# Patient Record
Sex: Male | Born: 2000 | Race: Black or African American | Hispanic: No | Marital: Single | Smoking: Never smoker
Health system: Southern US, Community
[De-identification: ages and names within clinical notes are randomized; demographics above are authoritative.]

## PROBLEM LIST (undated history)

## (undated) DIAGNOSIS — N200 Calculus of kidney: Secondary | ICD-10-CM

## (undated) DIAGNOSIS — J302 Other seasonal allergic rhinitis: Secondary | ICD-10-CM

---
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
## 2022-09-02 ENCOUNTER — Other Ambulatory Visit: Payer: Self-pay

## 2022-09-02 ENCOUNTER — Emergency Department (HOSPITAL_BASED_OUTPATIENT_CLINIC_OR_DEPARTMENT_OTHER)
Admission: EM | Admit: 2022-09-02 | Discharge: 2022-09-02 | Disposition: A | Discharge status: Home / Self Care | Payer: Medicaid Other | Attending: Emergency Medicine | Admitting: Emergency Medicine

## 2022-09-02 DIAGNOSIS — J029 Acute pharyngitis, unspecified: Secondary | ICD-10-CM | POA: Diagnosis present

## 2022-09-02 LAB — GROUP A STREP BY PCR: Group A Strep by PCR: DETECTED — AB

## 2022-09-02 MED ORDER — AMOXICILLIN 500 MG PO CAPS: 500.0000 mg | ORAL_CAPSULE | Freq: Every day | ORAL | 0 refills | Status: AC

## 2022-09-02 MED ORDER — DEXAMETHASONE 4 MG PO TABS
10.0000 mg | ORAL_TABLET | Freq: Once | ORAL | Status: AC
  Administered 2022-09-02: 10 mg via ORAL
  Filled 2022-09-02: qty 3

## 2022-09-02 NOTE — ED Notes (Signed)
Pt discharged home after verbalizing understanding of discharge instructions; nad noted. 

## 2022-09-02 NOTE — Discharge Instructions (Signed)
I am treating you for strep throat.  Take first dose antibiotic tonight.  You have been treated with a long-acting steroid.  Recommend 1000 mg of Tylenol every 6 hours as needed for pain as well as 800 mg ibuprofen every 8 hours as needed for pain.  The long-acting steroid should greatly help your discomfort as well.  Please return if you are not having any major improvement after 48 to 72 hours.

## 2022-09-02 NOTE — ED Triage Notes (Signed)
Pt via pov from home with sore throat x 2 days. Pt states it is very painful to talk or swallow or move his head side to side. He reports that he is using his nose for breathing and that he is able to swallow his saliva, although with significant pain. Pt alert & oriented, nad noted.

## 2022-09-02 NOTE — ED Provider Notes (Signed)
[place] EMERGENCY DEPARTMENT AT [facility] Provider Note   CSN: [id number] Arrival date & time: 09/02/22  2032     History  Chief Complaint  Patient presents with   Sore Throat    Darius Cruz is a 22 y.o. male.  Patient here with sore throat for can last couple days.  Nothing makes it worse or better.  He denies any fever or chills.  He is having some pain when he swallows.  He has no difficulty opening his mouth or eating or drinking.  Denies any sick contacts.  Denies any chest pain or shortness of breath.  The history is provided by the patient.       Home Medications Prior to Admission medications   Medication Sig Start Date End Date Taking? Authorizing Provider  amoxicillin (AMOXIL) 500 MG capsule Take 1 capsule (500 mg total) by mouth daily for 10 days. 09/02/22 09/12/22 Yes Chrishaun Sasso, DO  cetirizine (ZYRTEC) 10 MG tablet Take 10 mg by mouth daily.    [provider]  ibuprofen (ADVIL) 400 MG tablet Take 1 tablet (400 mg total) by mouth every 6 (six) hours as needed for up to 20 doses for mild pain or moderate pain. 04/29/19   Terald Sleeper, MD  Loratadine (CLARITIN PO) Take by mouth.    [provider]      Allergies    Patient has no known allergies.    Review of Systems   Review of Systems  Physical Exam Updated Vital Signs BP (!) 146/85 (BP Location: Right Arm)   Pulse 95   Temp 99.8 F (37.7 C) (Temporal)   Resp 18   Ht 5\' 9"  (1.753 m)   Wt 74.2 kg   SpO2 100%   BMI 24.16 kg/m  Physical Exam Vitals and nursing note reviewed.  Constitutional:      General: He is not in acute distress.    Appearance: He is well-developed.  HENT:     Head: Normocephalic and atraumatic.     Right Ear: Tympanic membrane normal.     Left Ear: Tympanic membrane normal.     Mouth/Throat:     Mouth: Mucous membranes are moist.     Pharynx: No uvula swelling.     Tonsils: Tonsillar exudate present. No tonsillar abscesses. 1+ on the  right. 1+ on the left.  Eyes:     Conjunctiva/sclera: Conjunctivae normal.  Cardiovascular:     Rate and Rhythm: Normal rate and regular rhythm.     Heart sounds: No murmur heard. Pulmonary:     Effort: Pulmonary effort is normal. No respiratory distress.     Breath sounds: Normal breath sounds.  Abdominal:     Palpations: Abdomen is soft.     Tenderness: There is no abdominal tenderness.  Musculoskeletal:        General: No swelling.     Cervical back: Normal range of motion and neck supple.  Lymphadenopathy:     Cervical: No cervical adenopathy.  Skin:    General: Skin is warm and dry.     Capillary Refill: Capillary refill takes less than 2 seconds.  Neurological:     Mental Status: He is alert.  Psychiatric:        Mood and Affect: Mood normal.     ED Results / Procedures / Treatments   Labs (all labs ordered are listed, but only abnormal results are displayed) Labs Reviewed  GROUP A STREP BY PCR    EKG None  Radiology No results found.  Procedures Procedures    Medications Ordered in ED Medications  dexamethasone (DECADRON) tablet 10 mg (has no administration in time range)    ED Course/ Medical Decision Making/ A&P                             Medical Decision Making Risk Prescription drug management.   Verneita Griffes here with sore throat.  Normal vitals.  No fever.  He appears to have a pharyngitis on exam.  I will empirically treat for strep pharyngitis.  Given Decadron.  He has no trismus or drooling.  No signs of abscess on exam.  No submandibular swelling.  Normal range of motion of the neck.  This could be viral process as well.  He understands return precautions.  Discharged in good condition.  Commend Tylenol and ibuprofen for pain as well.  This chart was dictated using voice recognition software.  Despite best efforts to proofread,  errors can occur which can change the documentation meaning.         Final Clinical Impression(s) / ED  Diagnoses Final diagnoses:  Sore throat    Rx / DC Orders ED Discharge Orders          Ordered    amoxicillin (AMOXIL) 500 MG capsule  Daily        09/02/22 2045              Virgina Norfolk, DO 09/02/22 2051

## 2022-09-15 ENCOUNTER — Other Ambulatory Visit: Payer: Self-pay

## 2022-09-15 ENCOUNTER — Emergency Department (HOSPITAL_BASED_OUTPATIENT_CLINIC_OR_DEPARTMENT_OTHER)
Admission: EM | Admit: 2022-09-15 | Discharge: 2022-09-15 | Disposition: A | Discharge status: Home / Self Care | Payer: Medicaid Other | Attending: Emergency Medicine | Admitting: Emergency Medicine

## 2022-09-15 ENCOUNTER — Encounter (HOSPITAL_BASED_OUTPATIENT_CLINIC_OR_DEPARTMENT_OTHER): Payer: Self-pay | Admitting: Emergency Medicine

## 2022-09-15 DIAGNOSIS — J020 Streptococcal pharyngitis: Secondary | ICD-10-CM

## 2022-09-15 DIAGNOSIS — B2790 Infectious mononucleosis, unspecified without complication: Secondary | ICD-10-CM | POA: Insufficient documentation

## 2022-09-15 DIAGNOSIS — J029 Acute pharyngitis, unspecified: Secondary | ICD-10-CM | POA: Diagnosis present

## 2022-09-15 LAB — MONONUCLEOSIS SCREEN: Mono Screen: POSITIVE — AB

## 2022-09-15 LAB — GROUP A STREP BY PCR: Group A Strep by PCR: DETECTED — AB

## 2022-09-15 MED ORDER — CEPHALEXIN 500 MG PO CAPS: 500.0000 mg | ORAL_CAPSULE | Freq: Two times a day (BID) | ORAL | 0 refills | Status: AC

## 2022-09-15 NOTE — ED Provider Notes (Signed)
[place] EMERGENCY DEPARTMENT AT [facility], [place] Provider Note   CSN: [id number] Arrival date & time: 09/15/22  1841     History  No chief complaint on file.   Darius Cruz is a 22 y.o. male, no pertinent past medical history, who presents to the ED secondary to sore throat for the last 2 days.  He feels like his tonsils are swollen again.  Just recently got treated for strep, and stopped treatment about 2 days ago, when his symptoms reoccurred.  He states that his girlfriend/partner was not treated, or tested for strep, and he has been seeing her after being treated.  Does not know if she gave it back to him, as now she is feeling sick.  Denies any fever, chills, cough, nausea, vomiting.  Reports bad fatigue.     Home Medications Prior to Admission medications   Medication Sig Start Date End Date Taking? Authorizing Provider  cephALEXin (KEFLEX) 500 MG capsule Take 1 capsule (500 mg total) by mouth 2 (two) times daily for 10 days. 09/15/22 09/25/22 Yes Cristle Jared L, PA  cetirizine (ZYRTEC) 10 MG tablet Take 10 mg by mouth daily.    [provider]  ibuprofen (ADVIL) 400 MG tablet Take 1 tablet (400 mg total) by mouth every 6 (six) hours as needed for up to 20 doses for mild pain or moderate pain. 04/29/19   Terald Sleeper, MD  Loratadine (CLARITIN PO) Take by mouth.    [provider]      Allergies    Patient has no known allergies.    Review of Systems   Review of Systems  HENT:  Positive for sore throat. Negative for facial swelling.     Physical Exam Updated Vital Signs BP 120/77 (BP Location: Right Arm)   Pulse 78   Temp 98.7 F (37.1 C) (Oral)   Resp 20   SpO2 100%  Physical Exam Vitals and nursing note reviewed.  Constitutional:      General: He is not in acute distress.    Appearance: He is well-developed.  HENT:     Head: Normocephalic and atraumatic.     Right Ear: Tympanic membrane normal.     Left Ear: Tympanic membrane  normal.     Nose: Nose normal.     Mouth/Throat:     Mouth: Mucous membranes are moist.     Pharynx: Uvula midline. Posterior oropharyngeal erythema present. No pharyngeal swelling or uvula swelling.     Tonsils: No tonsillar exudate or tonsillar abscesses. 3+ on the right. 3+ on the left.  Eyes:     Conjunctiva/sclera: Conjunctivae normal.  Cardiovascular:     Rate and Rhythm: Normal rate and regular rhythm.     Heart sounds: No murmur heard. Pulmonary:     Effort: Pulmonary effort is normal. No respiratory distress.     Breath sounds: Normal breath sounds.  Abdominal:     Palpations: Abdomen is soft.     Tenderness: There is no abdominal tenderness.  Musculoskeletal:        General: No swelling.     Cervical back: Neck supple.  Skin:    General: Skin is warm and dry.     Capillary Refill: Capillary refill takes less than 2 seconds.  Neurological:     Mental Status: He is alert.  Psychiatric:        Mood and Affect: Mood normal.     ED Results / Procedures / Treatments   Labs (all labs ordered  are listed, but only abnormal results are displayed) Labs Reviewed  GROUP A STREP BY PCR - Abnormal; Notable for the following components:      Result Value   Group A Strep by PCR DETECTED (*)    All other components within normal limits  MONONUCLEOSIS SCREEN - Abnormal; Notable for the following components:   Mono Screen POSITIVE (*)    All other components within normal limits    EKG None  Radiology No results found.  Procedures Procedures    Medications Ordered in ED Medications - No data to display  ED Course/ Medical Decision Making/ A&P                             Medical Decision Making Patient is a 22 year old male, here for sore throat for the last 2 days.  Just recently got he is treated for Trep strep, stopped treatment about 2 days ago after completing 10 days of treatment.  He was on amoxicillin, had resolution of symptoms, and now it is back.  States  his girlfriend is sick as well.  Will test for strep and mono given the fatigue.  Overall well-appearing, does not appear to have any kind of PTA.  Was compliant with antibiotics per patient.  Amount and/or Complexity of Data Reviewed Labs: ordered.    Details: Strep positive, mono positive Discussion of management or test interpretation with external provider(s): Discussed with patient, he is strep positive, and mono positive, it is possible that his girlfriend may have given it back to him, and caused him strep again, however is unclear, we will treat with him with Keflex this time, as he already finished a dose of amoxicillin.  Additionally we discussed mono, and return precautions, associated with this, and to avoid any kind of contact sport, as this may cause some splenic injury.  Risk Prescription drug management.    Final Clinical Impression(s) / ED Diagnoses Final diagnoses:  Infectious mononucleosis without complication, infectious mononucleosis due to unspecified organism  Strep pharyngitis    Rx / DC Orders ED Discharge Orders          Ordered    cephALEXin (KEFLEX) 500 MG capsule  2 times daily        09/15/22 2155              Pete Pelt, [place] 09/15/22 2157    Benjiman Core, MD 09/15/22 [phone]

## 2022-09-15 NOTE — ED Triage Notes (Signed)
Congested, runny nose (allergies). Pt just treated for strep throat. Finished and then the next day his throat was hurting and felt like tonsils were swollen.

## 2022-09-15 NOTE — Discharge Instructions (Addendum)
Please follow-up with your primary care doctor, you have strep and mono.  Please avoid any kind of high contact sports, as it may cause any kind of splenic injury.  Take the antibiotics as prescribed, you failed amoxicillin, so sent to Keflex for treatment of your strep.  Take it in completion.  Make sure that you have anyone that you she struggles with, kids, or have any kind of bodily fluids Swapp, with tested for strep, as this may be passing back-and-forth between you 2.  Return to the ER if you have any kind of facial swelling, difficulty breathing, worsening swelling of your throat.

## 2022-09-29 ENCOUNTER — Emergency Department (HOSPITAL_BASED_OUTPATIENT_CLINIC_OR_DEPARTMENT_OTHER)
Admission: EM | Admit: 2022-09-29 | Discharge: 2022-09-29 | Disposition: A | Discharge status: Home / Self Care | Payer: Medicaid Other | Attending: Emergency Medicine | Admitting: Emergency Medicine

## 2022-09-29 DIAGNOSIS — Z202 Contact with and (suspected) exposure to infections with a predominantly sexual mode of transmission: Secondary | ICD-10-CM | POA: Diagnosis present

## 2022-09-29 DIAGNOSIS — Z113 Encounter for screening for infections with a predominantly sexual mode of transmission: Secondary | ICD-10-CM

## 2022-09-29 LAB — HIV ANTIBODY (ROUTINE TESTING W REFLEX): HIV Screen 4th Generation wRfx: NONREACTIVE

## 2022-09-29 NOTE — Discharge Instructions (Signed)
Please read and follow all provided instructions.  Your diagnoses today include:  1. Screening examination for STI     Tests performed today include: Test for gonorrhea and chlamydia.  Test for HIV and syphilis.  Vital signs. See below for your results today.   Medications:  None  Home care instructions:  Read educational materials contained in this packet and follow any instructions provided.   You should tell your partners about your infection and avoid having sex for one week to allow time for the medicine to work.  Sexually transmitted disease testing also available at:  [facility] [address] [place], phone [phone] or [phone]   Monday - Friday, call for an appointment  Return instructions:  Please return to the Emergency Department if you experience worsening symptoms.  Please return if you have any other emergent concerns.  Additional Information:  Your vital signs today were: BP 132/81 (BP Location: Right Arm)   Pulse 72   Temp 98.4 F (36.9 C) (Oral)   Resp 17   SpO2 99%  If your blood pressure (BP) was elevated above 135/85 this visit, please have this repeated by your doctor within one month. --------------

## 2022-09-29 NOTE — ED Triage Notes (Signed)
Pt reports he is here for an STD check to followup on chlamydia tx he received in March.  Denies any symptoms at present.

## 2022-09-29 NOTE — ED Provider Notes (Signed)
[place] EMERGENCY DEPARTMENT AT [facility] Provider Note   CSN: [id number] Arrival date & time: 09/29/22  0849     History  Chief Complaint  Patient presents with   SEXUALLY TRANSMITTED DISEASE    Darius Cruz is a 22 y.o. male.  Patient presents to the emergency department today with request for STI testing.  Patient states that he had chlamydia 2 to 3 months ago.  He was treated with a one-time oral dose of antibiotics.  He is currently asymptomatic.  He would like testing today for reassurance due to a new sexual partner.  He denies penile discharge or drainage, dysuria, any rashes or lesions externally.  He is also agreeable to blood testing.  Of note, patient was diagnosed with strep throat and mononucleosis several weeks ago.  He states that from the standpoint he is gradually improving.  He continues to have a mild sore throat but states that the antibiotics he was given helped a lot.       Home Medications Prior to Admission medications   Medication Sig Start Date End Date Taking? Authorizing Provider  cetirizine (ZYRTEC) 10 MG tablet Take 10 mg by mouth daily.    [provider]  ibuprofen (ADVIL) 400 MG tablet Take 1 tablet (400 mg total) by mouth every 6 (six) hours as needed for up to 20 doses for mild pain or moderate pain. 04/29/19   Terald Sleeper, MD  Loratadine (CLARITIN PO) Take by mouth.    [provider]      Allergies    Patient has no known allergies.    Review of Systems   Review of Systems  Physical Exam Updated Vital Signs BP 132/81 (BP Location: Right Arm)   Pulse 72   Temp 98.4 F (36.9 C) (Oral)   Resp 17   SpO2 99%  Physical Exam Vitals and nursing note reviewed.  Constitutional:      Appearance: He is well-developed.  HENT:     Head: Normocephalic and atraumatic.  Eyes:     Conjunctiva/sclera: Conjunctivae normal.  Pulmonary:     Effort: No respiratory distress.  Genitourinary:    Comments:  Patient declines GU exam due to no symptoms Musculoskeletal:     Cervical back: Normal range of motion and neck supple.  Skin:    General: Skin is warm and dry.  Neurological:     Mental Status: He is alert.     ED Results / Procedures / Treatments   Labs (all labs ordered are listed, but only abnormal results are displayed) Labs Reviewed - No data to display  EKG None  Radiology No results found.  Procedures Procedures    Medications Ordered in ED Medications - No data to display  ED Course/ Medical Decision Making/ A&P    Patient seen and examined. History obtained directly from patient.    Labs/EKG: Urine GC and chlamydia ordered, HIV and RPR ordered.  Imaging: None ordered  Medications/Fluids: None ordered  Most recent vital signs reviewed and are as follows: BP 132/81 (BP Location: Right Arm)   Pulse 72   Temp 98.4 F (36.9 C) (Oral)   Resp 17   SpO2 99%   Initial impression: STI screening encounter  Home treatment plan: Patient counseled on safe sexual practices, encouraged them to avoid sexual contact for 7 days if any testing positive and to inform sexual partners so that they can get tested and treated as well.   Return instructions discussed with patient: As  needed  Follow-up instructions discussed with patient: PCP as needed                            Medical Decision Making Amount and/or Complexity of Data Reviewed Labs: ordered.   Patient asymptomatic, here for STD screening.  Appears well.        Final Clinical Impression(s) / ED Diagnoses Final diagnoses:  None    Rx / DC Orders ED Discharge Orders     None         Renne Crigler, PA-C 09/29/22 1021    Mardene Sayer, MD 09/29/22 1750

## 2022-09-30 LAB — RPR: RPR Ser Ql: NONREACTIVE

## 2022-10-02 LAB — GC/CHLAMYDIA PROBE AMP (~~LOC~~) NOT AT ARMC
Chlamydia: NEGATIVE
Comment: NEGATIVE
Comment: NORMAL
Neisseria Gonorrhea: NEGATIVE

## 2022-11-02 ENCOUNTER — Other Ambulatory Visit: Payer: Self-pay

## 2022-11-02 ENCOUNTER — Encounter (HOSPITAL_BASED_OUTPATIENT_CLINIC_OR_DEPARTMENT_OTHER): Payer: Self-pay | Admitting: Emergency Medicine

## 2022-11-02 ENCOUNTER — Emergency Department (HOSPITAL_BASED_OUTPATIENT_CLINIC_OR_DEPARTMENT_OTHER)
Admission: EM | Admit: 2022-11-02 | Discharge: 2022-11-02 | Disposition: A | Discharge status: Home / Self Care | Payer: Medicaid Other | Attending: Emergency Medicine | Admitting: Emergency Medicine

## 2022-11-02 DIAGNOSIS — N529 Male erectile dysfunction, unspecified: Secondary | ICD-10-CM | POA: Diagnosis present

## 2022-11-02 DIAGNOSIS — Z202 Contact with and (suspected) exposure to infections with a predominantly sexual mode of transmission: Secondary | ICD-10-CM

## 2022-11-02 LAB — URINALYSIS, ROUTINE W REFLEX MICROSCOPIC
Bilirubin Urine: NEGATIVE
Glucose, UA: NEGATIVE mg/dL
Hgb urine dipstick: NEGATIVE
Ketones, ur: NEGATIVE mg/dL
Leukocytes,Ua: NEGATIVE
Nitrite: NEGATIVE
Protein, ur: NEGATIVE mg/dL
Specific Gravity, Urine: 1.026 (ref 1.005–1.030)
pH: 7 (ref 5.0–8.0)

## 2022-11-02 LAB — HIV ANTIBODY (ROUTINE TESTING W REFLEX): HIV Screen 4th Generation wRfx: NONREACTIVE

## 2022-11-02 NOTE — ED Notes (Signed)
Discharge instructions and follow up care reviewed and explained, pt verbalized understanding and had no further questions on d/c. Pt caox4, ambulatory, NAD on d/c. 

## 2022-11-02 NOTE — ED Triage Notes (Signed)
Pt arrived POV from home. Pt caox4, ambulatory, NAD. Pt c/o "not being able to get an erection" for the past week but that it first started in May but has become a constant issue the past week. Pt also reports pain at the base of the penis and "pain in the vein in the penis feeling like something is blocking blood flow." Pt also reports bladder discomfort with increased frequency in urination with decreased output, however denies painful urination. Pt denies fever, chills, penile discharge. Hx kidney stones.

## 2022-11-02 NOTE — ED Notes (Signed)
Unsuccessful lab draw RT hand, pt tolerated well

## 2022-11-02 NOTE — ED Provider Notes (Signed)
[place] EMERGENCY DEPARTMENT AT [facility] Provider Note   CSN: [id number] Arrival date & time: 11/02/22  0802     History  Chief Complaint  Patient presents with   Erectile Dysfunction    Darius Cruz is a 22 y.o. male.  Patient is a 22 year old male with no significant past medical history presenting to the emergency department with erectile dysfunction.  The patient states for nearly the last month he has been unable to obtain an erection.  He states that he did have sex with a new partner 1 month ago and is concerned about possible STD exposure but denies any penile discharge or rash, testicular pain or swelling.  He also reports a history of a kidney stone and is concerned that he may have have a kidney stone.  He states since Monday he has had mild intermittent lower abdominal pain.  He denies any fevers or chills, nausea or vomiting, dysuria or hematuria.  Patient denies any recent new medications.  The history is provided by the patient.  Erectile Dysfunction       Home Medications Prior to Admission medications   Medication Sig Start Date End Date Taking? Authorizing Provider  cetirizine (ZYRTEC) 10 MG tablet Take 10 mg by mouth daily.    [provider]  ibuprofen (ADVIL) 400 MG tablet Take 1 tablet (400 mg total) by mouth every 6 (six) hours as needed for up to 20 doses for mild pain or moderate pain. 04/29/19   Terald Sleeper, MD  Loratadine (CLARITIN PO) Take by mouth.    [provider]      Allergies    Patient has no known allergies.    Review of Systems   Review of Systems  Physical Exam Updated Vital Signs BP 132/87 (BP Location: Left Arm)   Pulse 63   Temp 98.3 F (36.8 C) (Oral)   Resp 16   Ht 5\' 8"  (1.727 m)   Wt 79.4 kg   SpO2 99%   BMI 26.61 kg/m  Physical Exam Vitals and nursing note reviewed.  Constitutional:      General: He is not in acute distress.    Appearance: Normal appearance.  HENT:      Head: Normocephalic and atraumatic.     Nose: Nose normal.  Eyes:     Extraocular Movements: Extraocular movements intact.     Conjunctiva/sclera: Conjunctivae normal.  Cardiovascular:     Rate and Rhythm: Normal rate.     Heart sounds: Normal heart sounds.  Pulmonary:     Effort: Pulmonary effort is normal.     Breath sounds: Normal breath sounds.  Abdominal:     General: Abdomen is flat.     Palpations: Abdomen is soft.     Tenderness: There is no abdominal tenderness. There is no right CVA tenderness or left CVA tenderness.  Musculoskeletal:        General: Normal range of motion.     Cervical back: Normal range of motion.  Skin:    General: Skin is warm and dry.  Neurological:     General: No focal deficit present.     Mental Status: He is alert and oriented to person, place, and time.  Psychiatric:        Mood and Affect: Mood normal.        Behavior: Behavior normal.     ED Results / Procedures / Treatments   Labs (all labs ordered are listed, but only abnormal results are displayed) Labs  Reviewed  URINALYSIS, ROUTINE W REFLEX MICROSCOPIC  RPR  HIV ANTIBODY (ROUTINE TESTING W REFLEX)  GC/CHLAMYDIA PROBE AMP ([place]) NOT AT [facility]    EKG None  Radiology No results found.  Procedures Procedures    Medications Ordered in ED Medications - No data to display  ED Course/ Medical Decision Making/ A&P Clinical Course as of 11/02/22 1035  Fri Nov 02, 2022  1011 UA negative making stone unlikely. He is stable for discharge and will be given urology f/u. [VK]    Clinical Course User Index [VK] Rexford Maus, DO                             Medical Decision Making This patient presents to the ED with chief complaint(s) of erectile dysfunction with no pertinent past medical history which further complicates the presenting complaint. The complaint involves an extensive differential diagnosis and also carries with it a high risk of complications and  morbidity.    The differential diagnosis includes STD, UTI, pyelonephritis less likely as he has no significant pain and no associated nausea or vomiting, no recent medication changes making medication effect unlikely, no testicular pain or swelling making torsion or epididymitis unlikely  Additional history obtained: Additional history obtained from N/A Records reviewed prior ED records  ED Course and Reassessment: On patient's arrival he is hemodynamically stable in no acute distress.  The patient is agreeable to STD testing including HIV and syphilis but declined prophylactic treatment at this time.  UA will additionally be of performed to evaluate for hematuria or UTI and he will be closely reassessed.  Independent labs interpretation:  The following labs were independently interpreted: UA negative  Independent visualization of imaging: - N/A  Consultation: - Consulted or discussed management/test interpretation w/ external professional: N/A  Consideration for admission or further workup: Patient has no emergent conditions requiring admission or further work-up at this time and is stable for discharge home with primary care follow-up  Social Determinants of health: N/a    Amount and/or Complexity of Data Reviewed Labs: ordered.          Final Clinical Impression(s) / ED Diagnoses Final diagnoses:  Erectile dysfunction, unspecified erectile dysfunction type  Possible exposure to STD    Rx / DC Orders ED Discharge Orders     None         Rexford Maus, DO 11/02/22 1035

## 2022-11-02 NOTE — Discharge Instructions (Signed)
You were seen in the emergency department for your erectile dysfunction.  Your workup showed no signs of urinary tract infection or kidney stones.  We did test you for STDs and if any of these come back positive you will receive a call.  You should abstain from any drugs or alcohol and keep a healthy lifestyle to help with erectile dysfunction and you can follow-up with your primary doctor or urology for further investigation and management.  You should return to the emergency department if you have testicular pain or swelling, severe abdominal pain, fevers or any other new or concerning symptoms.

## 2022-11-03 LAB — RPR: RPR Ser Ql: NONREACTIVE

## 2022-11-05 LAB — GC/CHLAMYDIA PROBE AMP (~~LOC~~) NOT AT ARMC
Chlamydia: NEGATIVE
Comment: NEGATIVE
Comment: NORMAL
Neisseria Gonorrhea: NEGATIVE

## 2022-11-11 ENCOUNTER — Emergency Department (HOSPITAL_BASED_OUTPATIENT_CLINIC_OR_DEPARTMENT_OTHER)
Admission: EM | Admit: 2022-11-11 | Discharge: 2022-11-11 | Disposition: A | Discharge status: Home / Self Care | Payer: Medicaid Other | Source: Home / Self Care | Attending: Emergency Medicine | Admitting: Emergency Medicine

## 2022-11-11 ENCOUNTER — Encounter (HOSPITAL_BASED_OUTPATIENT_CLINIC_OR_DEPARTMENT_OTHER): Payer: Self-pay

## 2022-11-11 DIAGNOSIS — N6342 Unspecified lump in left breast, subareolar: Secondary | ICD-10-CM

## 2022-11-11 DIAGNOSIS — N529 Male erectile dysfunction, unspecified: Secondary | ICD-10-CM | POA: Insufficient documentation

## 2022-11-11 LAB — URINALYSIS, ROUTINE W REFLEX MICROSCOPIC
Bilirubin Urine: NEGATIVE
Glucose, UA: NEGATIVE mg/dL
Hgb urine dipstick: NEGATIVE
Ketones, ur: NEGATIVE mg/dL
Leukocytes,Ua: NEGATIVE
Nitrite: NEGATIVE
Protein, ur: NEGATIVE mg/dL
Specific Gravity, Urine: 1.01 (ref 1.005–1.030)
pH: 7 (ref 5.0–8.0)

## 2022-11-11 LAB — CBC WITH DIFFERENTIAL/PLATELET
Abs Immature Granulocytes: 0.01 10*3/uL (ref 0.00–0.07)
Basophils Absolute: 0 10*3/uL (ref 0.0–0.1)
Basophils Relative: 1 %
Eosinophils Absolute: 0.2 10*3/uL (ref 0.0–0.5)
Eosinophils Relative: 3 %
HCT: 42.3 % (ref 39.0–52.0)
Hemoglobin: 14.1 g/dL (ref 13.0–17.0)
Immature Granulocytes: 0 %
Lymphocytes Relative: 31 %
Lymphs Abs: 1.9 10*3/uL (ref 0.7–4.0)
MCH: 27.4 pg (ref 26.0–34.0)
MCHC: 33.3 g/dL (ref 30.0–36.0)
MCV: 82.1 fL (ref 80.0–100.0)
Monocytes Absolute: 0.5 10*3/uL (ref 0.1–1.0)
Monocytes Relative: 8 %
Neutro Abs: 3.5 10*3/uL (ref 1.7–7.7)
Neutrophils Relative %: 57 %
Platelets: 376 10*3/uL (ref 150–400)
RBC: 5.15 MIL/uL (ref 4.22–5.81)
RDW: 12.9 % (ref 11.5–15.5)
WBC: 6.1 10*3/uL (ref 4.0–10.5)
nRBC: 0 % (ref 0.0–0.2)

## 2022-11-11 LAB — COMPREHENSIVE METABOLIC PANEL
ALT: 9 U/L (ref 0–44)
AST: 17 U/L (ref 15–41)
Albumin: 4.5 g/dL (ref 3.5–5.0)
Alkaline Phosphatase: 50 U/L (ref 38–126)
Anion gap: 6 (ref 5–15)
BUN: 13 mg/dL (ref 6–20)
CO2: 32 mmol/L (ref 22–32)
Calcium: 9.3 mg/dL (ref 8.9–10.3)
Chloride: 98 mmol/L (ref 98–111)
Creatinine, Ser: 0.96 mg/dL (ref 0.61–1.24)
GFR, Estimated: >60 mL/min (ref >60)
Glucose, Bld: 97 mg/dL (ref 70–99)
Potassium: 4 mmol/L (ref 3.5–5.1)
Sodium: 136 mmol/L (ref 135–145)
Total Bilirubin: 0.6 mg/dL (ref 0.3–1.2)
Total Protein: 7.3 g/dL (ref 6.5–8.1)

## 2022-11-11 LAB — LIPASE, BLOOD: Lipase: <10 U/L — ABNORMAL LOW (ref 11–51)

## 2022-11-11 MED ORDER — HYOSCYAMINE SULFATE 0.125 MG SL SUBL: 0.1250 mg | SUBLINGUAL_TABLET | SUBLINGUAL | 1 refills | Status: AC | PRN

## 2022-11-11 NOTE — Discharge Instructions (Addendum)
You need to follow up asap with your primary care doctor about ALL of your complaints today abut especially about the breast mass on the Left. Follow up with urology  Abdominal (belly) pain can be caused by many things. Your caregiver performed an examination and possibly ordered blood/urine tests and imaging (CT scan, x-rays, ultrasound). Many cases can be observed and treated at home after initial evaluation in the emergency department. Even though you are being discharged home, abdominal pain can be unpredictable. Therefore, you need a repeated exam if your pain does not resolve, returns, or worsens. Most patients with abdominal pain don't have to be admitted to the hospital or have surgery, but serious problems like appendicitis and gallbladder attacks can start out as nonspecific pain. Many abdominal conditions cannot be diagnosed in one visit, so follow-up evaluations are very important. SEEK IMMEDIATE MEDICAL ATTENTION IF: The pain does not go away or becomes severe.  A temperature above 101 develops.  Repeated vomiting occurs (multiple episodes).  The pain becomes localized to portions of the abdomen. The right side could possibly be appendicitis. In an adult, the left lower portion of the abdomen could be colitis or diverticulitis.  Blood is being passed in stools or vomit (bright red or black tarry stools).  Return also if you develop chest pain, difficulty breathing, dizziness or fainting, or become confused, poorly responsive, or inconsolable (young children).   Free HIV and STD Testing These locations offer FREE confidential testing for HIV, Chlamydia, Gonorrhea, and Syphilis. Non-Traditional Testing Sites Address Telephone  [facility] [address], [place] [phone] Mondays 5pm - 7pm  [facility] [address] [place] [phone] Wednesdays 2pm-8pm  [employer] [facility] [address], [place] [phone] Thursdays 9am-12noon 1pm-4pm  [facility] [address], [place] [phone] Tuesdays Thursdays 9am-12noon 1pm-4pm  [facility] Department of [employer] offers free, confidential testing and treatment for HIV, Chlamydia, Gonorrhea, Syphilis, Herpes, Bacterial Vaginosis, Yeast, and Trichomoniasis. Traditional Testing   [facility]-[place] [facility] [address], [place] [phone]  Monday thru Friday  Call for an appointment  [facility] Department- [facility] [address] [place] [phone] Monday thru Friday  Call for anappointment.  If you have any questions about this information please call [phone]. 03/15/2011

## 2022-11-11 NOTE — ED Triage Notes (Signed)
Pt states he has pain x 2 week.in lower abd and urethra. Pt states he has had frequent voiding. Pt was tested for STDs and Utis 6/28, but they were negative per patient.   Pt states he has had erectile dysfunction and low sex drive.  Pt endorses "slight diarrhea", denies vomiting/nausea

## 2022-11-11 NOTE — ED Provider Notes (Signed)
[place] EMERGENCY DEPARTMENT AT [facility] Provider Note   CSN: [id number] Arrival date & time: 11/11/22  4098     History {Add pertinent medical, surgical, social history, OB history to HPI:1} Chief Complaint  Patient presents with   Abdominal Pain   Penile Discharge    Darius Cruz is a 22 y.o. male who presents with ED. Patient DENIES penile discharge. Patient attempted intercourse last night and states that he was unable to maintain an erection. He is very concerned about this. He also reports urinary frequency and abdominal pain. He states that the pain is in his lower and upper abdomen. Palpation makes it worse. It comes and goes. No other aggrevating or alleviating factors . He denies n/v/d/c.  He has has also noticed a bump on his L breast. He has a urology appt scheduled for August.   Abdominal Pain Penile Discharge Associated symptoms include abdominal pain.       Home Medications Prior to Admission medications   Medication Sig Start Date End Date Taking? Authorizing Provider  cetirizine (ZYRTEC) 10 MG tablet Take 10 mg by mouth daily.    [provider]  ibuprofen (ADVIL) 400 MG tablet Take 1 tablet (400 mg total) by mouth every 6 (six) hours as needed for up to 20 doses for mild pain or moderate pain. 04/29/19   Terald Sleeper, MD  Loratadine (CLARITIN PO) Take by mouth.    [provider]      Allergies    Patient has no known allergies.    Review of Systems   Review of Systems  Gastrointestinal:  Positive for abdominal pain.  Genitourinary:  Positive for penile discharge.    Physical Exam Updated Vital Signs BP 131/75 (BP Location: Left Arm)   Pulse 66   Temp 98.2 F (36.8 C) (Oral)   Resp 16   Ht 5\' 8"  (1.727 m)   Wt 79.4 kg   SpO2 99%   BMI 26.61 kg/m  Physical Exam Vitals and nursing note reviewed.  Constitutional:      General: He is not in acute distress.    Appearance: He is well-developed. He is not  diaphoretic.  HENT:     Head: Normocephalic and atraumatic.  Eyes:     General: No scleral icterus.    Conjunctiva/sclera: Conjunctivae normal.  Cardiovascular:     Rate and Rhythm: Normal rate and regular rhythm.     Heart sounds: Normal heart sounds.  Pulmonary:     Effort: Pulmonary effort is normal. No respiratory distress.     Breath sounds: Normal breath sounds.  Chest:     Comments: Palpable infra-areolar mass of L breast- feels like mammary tissue. No discharge or skin changes. Abdominal:     Palpations: Abdomen is soft.     Tenderness: There is no abdominal tenderness.  Musculoskeletal:     Cervical back: Normal range of motion and neck supple.  Skin:    General: Skin is warm and dry.  Neurological:     Mental Status: He is alert.  Psychiatric:        Behavior: Behavior normal.     ED Results / Procedures / Treatments   Labs (all labs ordered are listed, but only abnormal results are displayed) Labs Reviewed - No data to display  EKG None  Radiology No results found.  Procedures Procedures  {Document cardiac monitor, telemetry assessment procedure when appropriate:1}  Medications Ordered in ED Medications - No data to display  ED Course/  Medical Decision Making/ A&P   {   Click here for ABCD2, HEART and other calculatorsREFRESH Note before signing :1}                          Medical Decision Making  ***  {Document critical care time when appropriate:1} {Document review of labs and clinical decision tools ie heart score, Chads2Vasc2 etc:1}  {Document your independent review of radiology images, and any outside records:1} {Document your discussion with family members, caretakers, and with consultants:1} {Document social determinants of health affecting pt's care:1} {Document your decision making why or why not admission, treatments were needed:1} Final Clinical Impression(s) / ED Diagnoses Final diagnoses:  None    Rx / DC Orders ED Discharge  Orders     None

## 2022-11-14 ENCOUNTER — Emergency Department (HOSPITAL_BASED_OUTPATIENT_CLINIC_OR_DEPARTMENT_OTHER)
Admission: EM | Admit: 2022-11-14 | Discharge: 2022-11-14 | Disposition: A | Discharge status: Home / Self Care | Payer: Medicaid Other | Attending: Emergency Medicine | Admitting: Emergency Medicine

## 2022-11-14 ENCOUNTER — Encounter (HOSPITAL_BASED_OUTPATIENT_CLINIC_OR_DEPARTMENT_OTHER): Payer: Self-pay | Admitting: Emergency Medicine

## 2022-11-14 ENCOUNTER — Other Ambulatory Visit: Payer: Self-pay

## 2022-11-14 DIAGNOSIS — J020 Streptococcal pharyngitis: Secondary | ICD-10-CM | POA: Diagnosis not present

## 2022-11-14 DIAGNOSIS — J029 Acute pharyngitis, unspecified: Secondary | ICD-10-CM | POA: Diagnosis present

## 2022-11-14 LAB — GROUP A STREP BY PCR: Group A Strep by PCR: DETECTED — AB

## 2022-11-14 MED ORDER — LIDOCAINE VISCOUS HCL 2 % MT SOLN
15.0000 mL | Freq: Once | OROMUCOSAL | Status: AC
  Administered 2022-11-14: 15 mL via OROMUCOSAL
  Filled 2022-11-14: qty 15

## 2022-11-14 MED ORDER — AMOXICILLIN 500 MG PO CAPS
500.0000 mg | ORAL_CAPSULE | Freq: Once | ORAL | Status: AC
  Administered 2022-11-14: 500 mg via ORAL
  Filled 2022-11-14: qty 1

## 2022-11-14 MED ORDER — LIDOCAINE VISCOUS HCL 2 % MT SOLN: 15.0000 mL | Freq: Three times a day (TID) | OROMUCOSAL | 0 refills | Status: AC | PRN

## 2022-11-14 MED ORDER — AMOXICILLIN 500 MG PO CAPS: 500.0000 mg | ORAL_CAPSULE | Freq: Two times a day (BID) | ORAL | 0 refills | Status: AC

## 2022-11-14 NOTE — Discharge Instructions (Signed)
As discussed, your results show that you are positive for strep throat.  Take amoxicillin twice a day for 10 days. Please entirety of medication to ensure resolution of infection.  This lidocaine as needed for throat pain.  Please change your toothbrush when you complete the antibiotics to prevent reinfecting yourself.  If you develop a fever you can alternate between Ibuprofen and Tylenol every 4 hours as needed.  Get help right away if: You have new symptoms, such as vomiting, severe headache, stiff or painful neck, chest pain, or shortness of breath. You have severe throat pain, drooling, or changes in your voice. You have swelling of the neck, or the skin on the neck becomes red and tender. You have signs of dehydration, such as tiredness (fatigue), dry mouth, and decreased urination. You become increasingly sleepy, or you cannot wake up completely. Your joints become red or painful.

## 2022-11-14 NOTE — ED Triage Notes (Signed)
Patient states he thinks he has strep and is requesting antibiotics. Reports swollen tonsils and sore throat since Sunday.

## 2022-11-14 NOTE — ED Provider Notes (Signed)
[place] EMERGENCY DEPARTMENT AT [facility] Provider Note   CSN: [id number] Arrival date & time: 11/14/22  1719     History  Chief Complaint  Patient presents with   Sore Throat    Darius Cruz is a 22 y.o. male with no significant past medical history presents the ED today for sore throat.  Patient reports that his throat has been sore and pain with swallowing since Sunday but denies fever, cough, or vomiting.  He is still able to eat and drink.  He reports this is the third time getting strep throat in the past 3 months.  No sick contact at work.  Denies voice changes or drooling.  No other complaints or concerns at this time.    Home Medications Prior to Admission medications   Medication Sig Start Date End Date Taking? Authorizing Provider  amoxicillin (AMOXIL) 500 MG capsule Take 1 capsule (500 mg total) by mouth 2 (two) times daily for 10 days. 11/14/22 11/24/22 Yes Maxwell Marion, PA-C  lidocaine (XYLOCAINE) 2 % solution Use as directed 15 mLs in the mouth or throat 3 (three) times daily as needed for up to 3 days for mouth pain. 11/14/22 11/17/22 Yes Maxwell Marion, PA-C  cetirizine (ZYRTEC) 10 MG tablet Take 10 mg by mouth daily.    [provider]  hyoscyamine (LEVSIN/SL) 0.125 MG SL tablet Place 1 tablet (0.125 mg total) under the tongue every 4 (four) hours as needed for cramping (abdominal pain). Up to 1.25 mg daily 11/11/22   Arthor Captain, PA-C  ibuprofen (ADVIL) 400 MG tablet Take 1 tablet (400 mg total) by mouth every 6 (six) hours as needed for up to 20 doses for mild pain or moderate pain. 04/29/19   Terald Sleeper, MD  Loratadine (CLARITIN PO) Take by mouth.    [provider]      Allergies    Patient has no known allergies.    Review of Systems   Review of Systems  HENT:  Positive for sore throat.   All other systems reviewed and are negative.   Physical Exam Updated Vital Signs BP 139/77   Pulse 82   Temp 99.1 F (37.3 C)  (Oral)   Resp 18   Ht 5\' 8"  (1.727 m)   Wt 79.4 kg   SpO2 100%   BMI 26.61 kg/m  Physical Exam Vitals and nursing note reviewed.  Constitutional:      Appearance: Normal appearance.     Comments: No hot potato voice, drooling, or sitting in tripod position  HENT:     Head: Normocephalic and atraumatic.     Mouth/Throat:     Mouth: Mucous membranes are moist.     Pharynx: Oropharyngeal exudate and posterior oropharyngeal erythema present.     Comments: Enlarged tonsils bilaterally with exudates.  No edema or induration to the hard or soft palate.  Eyes:     Conjunctiva/sclera: Conjunctivae normal.     Pupils: Pupils are equal, round, and reactive to light.  Cardiovascular:     Rate and Rhythm: Normal rate and regular rhythm.     Pulses: Normal pulses.     Heart sounds: Normal heart sounds.  Pulmonary:     Effort: Pulmonary effort is normal.     Breath sounds: Normal breath sounds.  Abdominal:     Palpations: Abdomen is soft.     Tenderness: There is no abdominal tenderness.  Musculoskeletal:     Cervical back: Normal range of motion and neck supple.  Tenderness present. No rigidity.  Lymphadenopathy:     Cervical: Cervical adenopathy present.  Skin:    General: Skin is warm and dry.     Findings: No rash.  Neurological:     General: No focal deficit present.     Mental Status: He is alert.  Psychiatric:        Mood and Affect: Mood normal.        Behavior: Behavior normal.     ED Results / Procedures / Treatments   Labs (all labs ordered are listed, but only abnormal results are displayed) Labs Reviewed  GROUP A STREP BY PCR - Abnormal; Notable for the following components:      Result Value   Group A Strep by PCR DETECTED (*)    All other components within normal limits    EKG None  Radiology No results found.  Procedures Procedures: not indicated.   Medications Ordered in ED Medications  amoxicillin (AMOXIL) capsule 500 mg (has no administration in  time range)  lidocaine (XYLOCAINE) 2 % viscous mouth solution 15 mL (15 mLs Mouth/Throat Given 11/14/22 2157)    ED Course/ Medical Decision Making/ A&P                             Medical Decision Making Risk Prescription drug management.   This patient presents to the ED for concern of sore throat, this involves an extensive number of treatment options, and is a complaint that carries with it a high risk of complications and morbidity.   Differential diagnosis includes: URI, viral versus bacterial pharyngitis, etc.   Co morbidities that complicate the patient evaluation  History of strep throat   Additional history obtained:  Additional history obtained from patient's records.   Lab Tests:  I ordered and personally interpreted labs.  The pertinent results include:   Positive strep test    Problem List / ED Course / Critical interventions / Medication management  Sore throat I ordered medications including: Viscous lidocaine for throat pain and first dose of Amoxicillin given prior to discharge First dose of Amoxicillin given  I have reviewed the patients home medicines and have made adjustments as needed   Social Determinants of Health:  Social connection   Test / Admission - Considered:  Discussed results with patient. Patient is stable and safe for discharge home. Return precautions provided.        Final Clinical Impression(s) / ED Diagnoses Final diagnoses:  Strep throat    Rx / DC Orders ED Discharge Orders          Ordered    amoxicillin (AMOXIL) 500 MG capsule  2 times daily        11/14/22 2159    lidocaine (XYLOCAINE) 2 % solution  3 times daily PRN        11/14/22 2200              Maxwell Marion, PA-C 11/15/22 0017    Arby Barrette, MD 11/15/22 1334

## 2022-12-20 ENCOUNTER — Other Ambulatory Visit: Payer: Self-pay

## 2022-12-20 ENCOUNTER — Emergency Department (HOSPITAL_BASED_OUTPATIENT_CLINIC_OR_DEPARTMENT_OTHER)
Admission: EM | Admit: 2022-12-20 | Discharge: 2022-12-20 | Disposition: A | Discharge status: Home / Self Care | Payer: Medicaid Other | Attending: Emergency Medicine | Admitting: Emergency Medicine

## 2022-12-20 ENCOUNTER — Encounter (HOSPITAL_BASED_OUTPATIENT_CLINIC_OR_DEPARTMENT_OTHER): Payer: Self-pay

## 2022-12-20 DIAGNOSIS — J020 Streptococcal pharyngitis: Secondary | ICD-10-CM | POA: Diagnosis not present

## 2022-12-20 DIAGNOSIS — J029 Acute pharyngitis, unspecified: Secondary | ICD-10-CM | POA: Diagnosis present

## 2022-12-20 DIAGNOSIS — R030 Elevated blood-pressure reading, without diagnosis of hypertension: Secondary | ICD-10-CM | POA: Insufficient documentation

## 2022-12-20 LAB — GROUP A STREP BY PCR: Group A Strep by PCR: DETECTED — AB

## 2022-12-20 MED ORDER — AMOXICILLIN 500 MG PO CAPS
500.0000 mg | ORAL_CAPSULE | Freq: Once | ORAL | Status: AC
  Administered 2022-12-20: 500 mg via ORAL
  Filled 2022-12-20: qty 1

## 2022-12-20 MED ORDER — AMOXICILLIN 500 MG PO CAPS: 500.0000 mg | ORAL_CAPSULE | Freq: Three times a day (TID) | ORAL | 0 refills | Status: DC

## 2022-12-20 MED ORDER — IBUPROFEN 400 MG PO TABS
400.0000 mg | ORAL_TABLET | Freq: Once | ORAL | Status: AC
  Administered 2022-12-20: 400 mg via ORAL
  Filled 2022-12-20: qty 1

## 2022-12-20 MED ORDER — EPINEPHRINE 0.3 MG/0.3ML IJ SOAJ
INTRAMUSCULAR | Status: AC
  Filled 2022-12-20: qty 0.3

## 2022-12-20 NOTE — ED Provider Notes (Signed)
[place] EMERGENCY DEPARTMENT AT [facility] Provider Note   CSN: [id number] Arrival date & time: 12/20/22  1258     History  Chief Complaint  Patient presents with   Sore Throat    Darius Cruz is a 22 y.o. male.  Pt c/o sore throat for past few days, mild-mod, bilateral/symmetric. No trouble breathing or swallowing. No runny nose. No cough. No specific known ill contacts or known strep or mono exposure. No recent antibiotics. Notes mild urinary frequency and indicates has new sexual partner so wants to be check for gc/chlamydia. Has been drinking a lot of fluids. No hx dm. No abd or flank pain. No nvd. No rash. No headache.   The history is provided by the patient and medical records.  Sore Throat Pertinent negatives include no abdominal pain, no headaches and no shortness of breath.       Home Medications Prior to Admission medications   Medication Sig Start Date End Date Taking? Authorizing Provider  cetirizine (ZYRTEC) 10 MG tablet Take 10 mg by mouth daily.    [provider]  hyoscyamine (LEVSIN/SL) 0.125 MG SL tablet Place 1 tablet (0.125 mg total) under the tongue every 4 (four) hours as needed for cramping (abdominal pain). Up to 1.25 mg daily 11/11/22   Arthor Captain, PA-C  ibuprofen (ADVIL) 400 MG tablet Take 1 tablet (400 mg total) by mouth every 6 (six) hours as needed for up to 20 doses for mild pain or moderate pain. 04/29/19   Terald Sleeper, MD  Loratadine (CLARITIN PO) Take by mouth.    [provider]      Allergies    Patient has no known allergies.    Review of Systems   Review of Systems  Constitutional:  Negative for fever.  HENT:  Positive for sore throat. Negative for congestion, rhinorrhea and trouble swallowing.   Eyes:  Negative for redness.  Respiratory:  Negative for cough and shortness of breath.   Gastrointestinal:  Negative for abdominal pain, nausea and vomiting.  Genitourinary:  Negative for dysuria,  penile discharge and testicular pain.  Skin:  Negative for rash.  Neurological:  Negative for headaches.    Physical Exam Updated Vital Signs BP (!) 144/92   Pulse 90   Temp 97.6 F (36.4 C)   Resp 16   SpO2 100%  Physical Exam Vitals and nursing note reviewed.  Constitutional:      Appearance: Normal appearance. He is well-developed.  HENT:     Head: Atraumatic.     Nose: Nose normal.     Mouth/Throat:     Mouth: Mucous membranes are moist.     Comments: Pharynx erythematous, moderate symmetric tonsillar enlargement. No asymmetric swelling or abscess.  Eyes:     General: No scleral icterus.    Conjunctiva/sclera: Conjunctivae normal.  Neck:     Trachea: No tracheal deviation.     Comments: Anterior cervical l/a, no posterior cervical l/a. No neck stiffness or rigidity.  Cardiovascular:     Rate and Rhythm: Normal rate.     Pulses: Normal pulses.  Pulmonary:     Effort: Pulmonary effort is normal. No accessory muscle usage or respiratory distress.     Breath sounds: Normal breath sounds.  Abdominal:     Palpations: Abdomen is soft.     Tenderness: There is no abdominal tenderness. There is no guarding.     Comments: No hsm.   Genitourinary:    Comments: Normal external gu exam. No  penile discharge. Swab sent.  Musculoskeletal:        General: No swelling.     Cervical back: Normal range of motion and neck supple. No rigidity.  Lymphadenopathy:     Cervical: Cervical adenopathy present.  Skin:    General: Skin is warm and dry.     Findings: No rash.  Neurological:     Mental Status: He is alert.     Comments: Alert, speech clear.   Psychiatric:        Mood and Affect: Mood normal.     ED Results / Procedures / Treatments   Labs (all labs ordered are listed, but only abnormal results are displayed) Results for orders placed or performed during the hospital encounter of 12/20/22  Group A Strep by PCR   Specimen: Throat; Sterile Swab  Result Value Ref Range    Group A Strep by PCR DETECTED (A) NOT DETECTED     EKG None  Radiology No results found.  Procedures Procedures    Medications Ordered in ED Medications  ibuprofen (ADVIL) tablet 400 mg (has no administration in time range)  amoxicillin (AMOXIL) capsule 500 mg (has no administration in time range)    ED Course/ Medical Decision Making/ A&P                                 Medical Decision Making Problems Addressed: Elevated blood pressure reading: acute illness or injury Strep pharyngitis: acute illness or injury with systemic symptoms  Amount and/or Complexity of Data Reviewed External Data Reviewed: labs and notes. Labs: ordered. Decision-making details documented in ED Course.  Risk Prescription drug management.   Swabs sent.   Reviewed nursing notes and prior charts for additional history.   Labs reviewed/interpreted by me - strep pos. Given several recurrent strep infections in past year, rec ent f/u.  Pt declines any blood work, refuses cbg check.   Confirmed nkda. Amoxicillin po. Ibuprofen po.  Overall pt is very well, not toxic appearing. No trouble breathing or swallowing. Pt currently appears stable for d/c.   Rec ent f/u.  Return precautions provided.           Final Clinical Impression(s) / ED Diagnoses Final diagnoses:  None    Rx / DC Orders ED Discharge Orders     None         Cathren Laine, MD 12/20/22 1446

## 2022-12-20 NOTE — Discharge Instructions (Signed)
It was our pleasure to provide your ER care today - we hope that you feel better.  Your strep test is positive. Take antibiotic as prescribed. Take acetaminophen or ibuprofen as need. You may use throat lozenges as need for symptom relief.   Given recurrent throat/strep infections, follow up with ENT doctor in the next 1-2 weeks - call office to arrange appointment.   Your blood pressure is high today - follow up with primary care doctor in the next 1-2 weeks.  Your GC/Chlamydia test should be resulted within the next day - check MyChart or call for results.   Return to ER if worse, new symptoms, unable to swallow, trouble breathing, or other concern.

## 2022-12-20 NOTE — ED Triage Notes (Signed)
Pt c/o sore throat, swollen tonsils x2wks. Associated HA, states his migraine pill helped.   Pt states he would also like to be checked for gc/chlamydia. C/o frequent urination, states he has been drinking more water but also got a new partner recently

## 2022-12-20 NOTE — ED Notes (Signed)
Discharge paperwork given and verbally understood. 

## 2022-12-20 NOTE — ED Notes (Signed)
Pt refused CBG.

## 2022-12-21 LAB — GC/CHLAMYDIA PROBE AMP (~~LOC~~) NOT AT ARMC
Chlamydia: NEGATIVE
Comment: NEGATIVE
Comment: NORMAL
Neisseria Gonorrhea: NEGATIVE

## 2023-02-13 ENCOUNTER — Other Ambulatory Visit: Payer: Self-pay

## 2023-02-13 ENCOUNTER — Emergency Department (HOSPITAL_BASED_OUTPATIENT_CLINIC_OR_DEPARTMENT_OTHER)
Admission: EM | Admit: 2023-02-13 | Discharge: 2023-02-13 | Disposition: A | Discharge status: Home / Self Care | Payer: Medicaid Other | Attending: Emergency Medicine | Admitting: Emergency Medicine

## 2023-02-13 ENCOUNTER — Encounter (HOSPITAL_BASED_OUTPATIENT_CLINIC_OR_DEPARTMENT_OTHER): Payer: Self-pay

## 2023-02-13 DIAGNOSIS — Z202 Contact with and (suspected) exposure to infections with a predominantly sexual mode of transmission: Secondary | ICD-10-CM | POA: Insufficient documentation

## 2023-02-13 DIAGNOSIS — R350 Frequency of micturition: Secondary | ICD-10-CM | POA: Diagnosis not present

## 2023-02-13 LAB — URINALYSIS, ROUTINE W REFLEX MICROSCOPIC
Bilirubin Urine: NEGATIVE
Glucose, UA: NEGATIVE mg/dL
Hgb urine dipstick: NEGATIVE
Ketones, ur: NEGATIVE mg/dL
Leukocytes,Ua: NEGATIVE
Nitrite: NEGATIVE
Protein, ur: NEGATIVE mg/dL
Specific Gravity, Urine: 1.018 (ref 1.005–1.030)
pH: 7.5 (ref 5.0–8.0)

## 2023-02-13 MED ORDER — CEFTRIAXONE SODIUM 500 MG IJ SOLR
500.0000 mg | Freq: Once | INTRAMUSCULAR | Status: AC
  Administered 2023-02-13: 500 mg via INTRAMUSCULAR
  Filled 2023-02-13: qty 500

## 2023-02-13 MED ORDER — DOXYCYCLINE HYCLATE 100 MG PO CAPS: 100.0000 mg | ORAL_CAPSULE | Freq: Two times a day (BID) | ORAL | 0 refills | Status: AC

## 2023-02-13 NOTE — Discharge Instructions (Addendum)
For letting us evaluate you today.  Your urine was negative for acute infection.  You had 1 dose of an antibiotic here to cover for gonorrhea and you need to pick up doxycycline at your pharmacy for chlamydia treatment.  I have sent antibiotic for you to take 1 capsule by mouth 2 times daily for 10 days to your pharmacy [facility] at [address].  Even if you are starting feel better please make sure to finish entire antibiotic course.  Antibiotics may make you have GI upset and this is normal.  Return to emergency department if your symptoms worsen

## 2023-02-13 NOTE — ED Provider Notes (Signed)
EMERGENCY DEPARTMENT AT [facility] Provider Note   CSN: [id number] Arrival date & time: 02/13/23  1718     History  Chief Complaint  Patient presents with   Exposure to STD   Urinary Frequency    Darius Cruz is a 22 y.o. male with no significant past medical history presents to the emergency department for evaluation of frequent urination, urethral pain, penile discharge over the past week.  He reports that his partner tested positive for chlamydia.  He reports he has also had diarrhea once or twice a day for the past week.  He denies fevers, vomiting, testicular pain.   Exposure to STD Pertinent negatives include no chest pain, no abdominal pain, no headaches and no shortness of breath.  Urinary Frequency Pertinent negatives include no chest pain, no abdominal pain, no headaches and no shortness of breath.      Home Medications Prior to Admission medications   Medication Sig Start Date End Date Taking? Authorizing Provider  doxycycline (VIBRAMYCIN) 100 MG capsule Take 1 capsule (100 mg total) by mouth 2 (two) times daily for 10 days. 02/13/23 02/23/23 Yes Judithann Sheen, PA  amoxicillin (AMOXIL) 500 MG capsule Take 1 capsule (500 mg total) by mouth 3 (three) times daily. 12/20/22   Cathren Laine, MD  cetirizine (ZYRTEC) 10 MG tablet Take 10 mg by mouth daily.    [provider]  hyoscyamine (LEVSIN/SL) 0.125 MG SL tablet Place 1 tablet (0.125 mg total) under the tongue every 4 (four) hours as needed for cramping (abdominal pain). Up to 1.25 mg daily 11/11/22   Arthor Captain, PA-C  ibuprofen (ADVIL) 400 MG tablet Take 1 tablet (400 mg total) by mouth every 6 (six) hours as needed for up to 20 doses for mild pain or moderate pain. 04/29/19   Terald Sleeper, MD  Loratadine (CLARITIN PO) Take by mouth.    [provider]      Allergies    Other    Review of Systems   Review of Systems  Constitutional:  Negative for chills, fatigue and  fever.  Respiratory:  Negative for cough, chest tightness, shortness of breath and wheezing.   Cardiovascular:  Negative for chest pain and palpitations.  Gastrointestinal:  Negative for abdominal pain, constipation, diarrhea, nausea and vomiting.  Genitourinary:  Positive for frequency and penile discharge.  Neurological:  Negative for dizziness, seizures, weakness, light-headedness, numbness and headaches.    Physical Exam Updated Vital Signs BP 115/76   Pulse 60   Temp 98.1 F (36.7 C)   Resp 16   Ht 5\' 8"  (1.727 m)   Wt 81.2 kg   SpO2 100%   BMI 27.22 kg/m  Physical Exam Vitals and nursing note reviewed.  Constitutional:      General: He is not in acute distress.    Appearance: Normal appearance. He is not ill-appearing.  HENT:     Head: Normocephalic and atraumatic.  Eyes:     Conjunctiva/sclera: Conjunctivae normal.  Cardiovascular:     Rate and Rhythm: Normal rate.  Pulmonary:     Effort: Pulmonary effort is normal. No respiratory distress.     Breath sounds: Normal breath sounds.  Chest:     Chest wall: No tenderness.  Abdominal:     General: There is no distension.     Palpations: Abdomen is soft. There is no mass.     Tenderness: There is no abdominal tenderness. There is no right CVA tenderness, left CVA tenderness, guarding  or rebound.  Genitourinary:    Comments: Patient refused GU exam Musculoskeletal:     Cervical back: Normal range of motion and neck supple. No rigidity or tenderness.  Lymphadenopathy:     Cervical: No cervical adenopathy.  Skin:    General: Skin is warm.     Coloration: Skin is not jaundiced or pale.  Neurological:     Mental Status: He is alert. Mental status is at baseline.     ED Results / Procedures / Treatments   Labs (all labs ordered are listed, but only abnormal results are displayed) Labs Reviewed  URINALYSIS, ROUTINE W REFLEX MICROSCOPIC  GC/CHLAMYDIA PROBE AMP ([place]) NOT AT [facility]     EKG None  Radiology No results found.  Procedures Procedures    Medications Ordered in ED Medications  cefTRIAXone (ROCEPHIN) injection 500 mg (500 mg Intramuscular Given 02/13/23 2014)    ED Course/ Medical Decision Making/ A&P                                 Medical Decision Making Amount and/or Complexity of Data Reviewed Labs: ordered.    Patient presents to the ED for concern of exposure to STD, this involves an extensive number of treatment options, and is a complaint that carries with it a high risk of complications and morbidity.  The differential diagnosis includes chlamydia, gonorrhea, UTI   Co morbidities that complicate the patient evaluation  None   Additional history obtained:  External records from outside source obtained upon chart review    Medicines ordered and prescription drug management:  I ordered medication including Rocephin 500 mg IM in ED for possible gonorrhea infection and exposure I prescribed doxycycline for possible chlamydial infection and exposure Reevaluation of the patient after these medicines showed that the patient stayed the same I have reviewed the patients home medicines and have made adjustments as needed   Problem List / ED Course:  Exposure to STD   Reevaluation:  After the interventions noted above, I reevaluated the patient and found that they have :stayed the same    Dispostion:  Patient is resting comfortably in bed.  No physical signs of distress.  Vital signs WNL.  See HPI.  Upon evaluation, patient has no complaints of chest pain or shortness of breath.  He speaks in full complete sentences.  No complaints of abdominal pain or tenderness.  No back pain or CVA tenderness.  Patient refuses GU exam and prefers to self swab for chlamydia and gonorrhea.  As he has urinary symptoms, will obtain UA.  Patient refuses GU exam at this time and prefers to self swab.  UA negative for acute infection.  After  consideration of the diagnostic results and the patients response to treatment, I feel that the patent would benefit from prophylactic treatment of gonorrhea and chlamydia due to exposure and symptoms.  Provided Rocephin IM in the ED for gonorrhea coverage and sent prescription for doxycycline for chlamydial coverage.  Discussed importance of finishing entire course and refraining from sexual contact until completion of antibiotic course.  Discussed that doxycycline may cause GI upset.  Findings, disposition, return to emergency department precautions explained patient who expresses understanding agrees with plan.         Final Clinical Impression(s) / ED Diagnoses Final diagnoses:  Exposure to STD    Rx / DC Orders ED Discharge Orders  Ordered    doxycycline (VIBRAMYCIN) 100 MG capsule  2 times daily        02/13/23 2004              Judithann Sheen, [place] 02/13/23 2028    Benjiman Core, MD 02/13/23 [phone]

## 2023-02-13 NOTE — ED Triage Notes (Signed)
Patient arrives via POV with complaints of urinary frequency, irritation to groin. He is concerned he may have a STD (chlamydia).

## 2023-02-13 NOTE — ED Notes (Signed)
Reviewed AVS with patient, patient expressed understanding of directions, denies further questions at this time. 

## 2023-02-14 LAB — GC/CHLAMYDIA PROBE AMP (~~LOC~~) NOT AT ARMC
Chlamydia: NEGATIVE
Comment: NEGATIVE
Comment: NORMAL
Neisseria Gonorrhea: NEGATIVE

## 2023-04-07 ENCOUNTER — Emergency Department (HOSPITAL_BASED_OUTPATIENT_CLINIC_OR_DEPARTMENT_OTHER)
Admission: EM | Admit: 2023-04-07 | Discharge: 2023-04-07 | Disposition: A | Discharge status: Home / Self Care | Payer: Medicaid Other | Attending: Emergency Medicine | Admitting: Emergency Medicine

## 2023-04-07 DIAGNOSIS — Z113 Encounter for screening for infections with a predominantly sexual mode of transmission: Secondary | ICD-10-CM | POA: Insufficient documentation

## 2023-04-07 DIAGNOSIS — R300 Dysuria: Secondary | ICD-10-CM | POA: Diagnosis present

## 2023-04-07 LAB — URINALYSIS, ROUTINE W REFLEX MICROSCOPIC
Bilirubin Urine: NEGATIVE
Glucose, UA: NEGATIVE mg/dL
Hgb urine dipstick: NEGATIVE
Ketones, ur: NEGATIVE mg/dL
Nitrite: NEGATIVE
Protein, ur: 30 mg/dL — AB
Specific Gravity, Urine: 1.034 — ABNORMAL HIGH (ref 1.005–1.030)
pH: 6 (ref 5.0–8.0)

## 2023-04-07 MED ORDER — DOXYCYCLINE HYCLATE 100 MG PO CAPS: 100.0000 mg | ORAL_CAPSULE | Freq: Two times a day (BID) | ORAL | 0 refills | Status: DC

## 2023-04-07 MED ORDER — LIDOCAINE HCL (PF) 1 % IJ SOLN
INTRAMUSCULAR | Status: AC
  Administered 2023-04-07: 1 mL
  Filled 2023-04-07: qty 5

## 2023-04-07 MED ORDER — CEFTRIAXONE SODIUM 500 MG IJ SOLR
500.0000 mg | Freq: Once | INTRAMUSCULAR | Status: AC
  Administered 2023-04-07: 500 mg via INTRAMUSCULAR
  Filled 2023-04-07: qty 500

## 2023-04-07 NOTE — Discharge Instructions (Signed)
You may have a sexually transmitted infection that is resistant to the antibiotic you got at the end of October.  They are now recommending a 1 week prescription for an antibiotic to cover for chlamydia.  New test were sent here however avoid any sexual contact until you completely complete the course of antibiotic and symptoms resolved.  Also your partner should be retested and most likely treated.

## 2023-04-07 NOTE — ED Provider Notes (Signed)
[place] EMERGENCY DEPARTMENT AT [facility] Provider Note   CSN: [id number] Arrival date & time: 04/07/23  1016     History  Chief Complaint  Patient presents with   Dysuria    Darius Cruz is a 22 y.o. male.  Patient is a healthy 22 year old male with no known medical problems presenting today with complaint of ongoing intermittent itching and occasional burning when he urinates.  It has been on and off for the last 1 month.  He reports prior to that he was diagnosed with chlamydia in May and took a one-time pill symptoms resolved but then started having symptoms again in October was tested tested positive for chlamydia and he reports they gave him another one-time pill but this time the symptoms have just not completely resolved.  His partner was also tested at the time and treated.  She is not having any further symptoms but he continues to be sexually active with this partner and does not always use protection.  Denies any lesions on his testicle or penis.  No abdominal pain or fevers.  He is sexually active with only 1 male partner over the last several months.   Dysuria Presenting symptoms: dysuria        Home Medications Prior to Admission medications   Medication Sig Start Date End Date Taking? Authorizing Provider  doxycycline (VIBRAMYCIN) 100 MG capsule Take 1 capsule (100 mg total) by mouth 2 (two) times daily. 04/07/23  Yes Quinlee Sciarra, Alphonzo Lemmings, MD  amoxicillin (AMOXIL) 500 MG capsule Take 1 capsule (500 mg total) by mouth 3 (three) times daily. 12/20/22   Cathren Laine, MD  cetirizine (ZYRTEC) 10 MG tablet Take 10 mg by mouth daily.    [provider]  hyoscyamine (LEVSIN/SL) 0.125 MG SL tablet Place 1 tablet (0.125 mg total) under the tongue every 4 (four) hours as needed for cramping (abdominal pain). Up to 1.25 mg daily 11/11/22   Arthor Captain, PA-C  ibuprofen (ADVIL) 400 MG tablet Take 1 tablet (400 mg total) by mouth every 6 (six) hours as needed  for up to 20 doses for mild pain or moderate pain. 04/29/19   Terald Sleeper, MD  Loratadine (CLARITIN PO) Take by mouth.    [provider]      Allergies    Other    Review of Systems   Review of Systems  Genitourinary:  Positive for dysuria.    Physical Exam Updated Vital Signs BP 122/83 (BP Location: Right Arm)   Pulse 68   Temp 98.2 F (36.8 C)   Resp 15   SpO2 99%  Physical Exam Vitals reviewed.  Cardiovascular:     Rate and Rhythm: Normal rate.  Pulmonary:     Effort: Pulmonary effort is normal.  Abdominal:     General: Abdomen is flat.     Palpations: Abdomen is soft.     Tenderness: There is no abdominal tenderness.  Skin:    General: Skin is warm.  Neurological:     Mental Status: He is alert. Mental status is at baseline.     ED Results / Procedures / Treatments   Labs (all labs ordered are listed, but only abnormal results are displayed) Labs Reviewed  URINALYSIS, ROUTINE W REFLEX MICROSCOPIC - Abnormal; Notable for the following components:      Result Value   Specific Gravity, Urine 1.034 (*)    Protein, ur 30 (*)    Leukocytes,Ua TRACE (*)    Bacteria, UA RARE (*)  All other components within normal limits  GC/CHLAMYDIA PROBE AMP ([place]) NOT AT [facility]    EKG None  Radiology No results found.  Procedures Procedures    Medications Ordered in ED Medications  cefTRIAXone (ROCEPHIN) injection 500 mg (has no administration in time range)    ED Course/ Medical Decision Making/ A&P                                 Medical Decision Making Amount and/or Complexity of Data Reviewed Labs: ordered. Decision-making details documented in ED Course.  Risk Prescription drug management.   Patient presenting today with symptoms concerning for possible resistant chlamydia.  Reports that he was tested and had chlamydia at the end of October was given a one-time pill assuming azithromycin but his symptoms have never fully  resolved.  He has not had any new sexual partners reports that his male partner is not having symptoms but they have not use protection every time.  Will recheck for gonorrhea and chlamydia.  Patient was treated with Rocephin but was given a 1 week course of doxycycline.  Discussed with him no sexual activity until completes his course and symptoms resolved.  Also recommend that his partner be retested as she may be asymptomatic at this time.        Final Clinical Impression(s) / ED Diagnoses Final diagnoses:  Screening for STD (sexually transmitted disease)    Rx / DC Orders ED Discharge Orders          Ordered    doxycycline (VIBRAMYCIN) 100 MG capsule  2 times daily        04/07/23 1105              Gwyneth Sprout, MD 04/07/23 1119

## 2023-04-07 NOTE — ED Triage Notes (Signed)
Pt reports mild irritation when he urinates for past month and would like to get checked for STDs.  Denies any unusual odor, blood in urine, discharge or other symptoms.

## 2023-04-08 LAB — GC/CHLAMYDIA PROBE AMP (~~LOC~~) NOT AT ARMC
Chlamydia: NEGATIVE
Comment: NEGATIVE
Comment: NORMAL
Neisseria Gonorrhea: NEGATIVE

## 2023-04-11 ENCOUNTER — Emergency Department (HOSPITAL_BASED_OUTPATIENT_CLINIC_OR_DEPARTMENT_OTHER)
Admission: EM | Admit: 2023-04-11 | Discharge: 2023-04-11 | Disposition: A | Discharge status: Home / Self Care | Payer: Medicaid Other | Attending: Emergency Medicine | Admitting: Emergency Medicine

## 2023-04-11 ENCOUNTER — Encounter (HOSPITAL_BASED_OUTPATIENT_CLINIC_OR_DEPARTMENT_OTHER): Payer: Self-pay

## 2023-04-11 DIAGNOSIS — R300 Dysuria: Secondary | ICD-10-CM | POA: Diagnosis present

## 2023-04-11 LAB — HIV ANTIBODY (ROUTINE TESTING W REFLEX): HIV Screen 4th Generation wRfx: NONREACTIVE

## 2023-04-11 MED ORDER — CEPHALEXIN 500 MG PO CAPS: 500.0000 mg | ORAL_CAPSULE | Freq: Three times a day (TID) | ORAL | 0 refills | Status: AC

## 2023-04-11 NOTE — Discharge Instructions (Signed)
We have given you round of antibiotics to try and see if this help alleviate your symptoms.  Please call the urologist listed in your discharge paperwork to follow-up  Return for any worsening symptoms

## 2023-04-11 NOTE — ED Notes (Signed)
Report given to the next RN.Marland KitchenMarland Kitchen

## 2023-04-11 NOTE — ED Triage Notes (Signed)
Pt advises he thinks he has UTI; first thought it was chlamydia but tested negative x105mos  Pain w urination, relieved w proper hydration, pelvic pain "w lump" on R side, lower abd pain, urethra pain

## 2023-04-11 NOTE — ED Provider Notes (Signed)
[place] EMERGENCY DEPARTMENT AT [facility] Provider Note   CSN: [id number] Arrival date & time: 04/11/23  1826    History  Dysuria   Darius Cruz is a 22 y.o. male here for evaluation of dysuria.  He has had ongoing burning with urination on and off over the last month or 2.  He is presenting diagnosed with chlamydia and he thought initially he had this however after being treated his symptoms returned.  His partner was tested as well and treated.  Continues to have sexual intercourse, does not always use protection.  He states his HIV and syphilis testing was also negative over the summer.  He continues to have symptoms.  He denies any fever, nausea vomiting, sore throat, pelvic pain discharge, rashes, lesions, pain with bowel movement.  Active with 1 partner.  He started taking the doxycycline after being seen in the ED 3 days ago however he is still concerned he has a UTI.  He did mention when the pain is at the worst he had a "knot" in his groin which resolved.  He states after he does drink moderate fluids his symptoms to improve.  He denies any frequency, urgency, hematuria, no history of kidney stones.  He denies any recent trauma or injury to his abdomen, penile region.  HPI     Home Medications Prior to Admission medications   Medication Sig Start Date End Date Taking? Authorizing Provider  cephALEXin (KEFLEX) 500 MG capsule Take 1 capsule (500 mg total) by mouth 3 (three) times daily. 04/11/23  Yes Fredrico Beedle A, PA-C  cetirizine (ZYRTEC) 10 MG tablet Take 10 mg by mouth daily.    [provider]  hyoscyamine (LEVSIN/SL) 0.125 MG SL tablet Place 1 tablet (0.125 mg total) under the tongue every 4 (four) hours as needed for cramping (abdominal pain). Up to 1.25 mg daily 11/11/22   Arthor Captain, PA-C  ibuprofen (ADVIL) 400 MG tablet Take 1 tablet (400 mg total) by mouth every 6 (six) hours as needed for up to 20 doses for mild pain or moderate pain. 04/29/19    Terald Sleeper, MD  Loratadine (CLARITIN PO) Take by mouth.    [provider]      Allergies    Other    Review of Systems   Review of Systems  Constitutional: Negative.   HENT: Negative.    Respiratory: Negative.    Cardiovascular: Negative.   Gastrointestinal: Negative.   Genitourinary:  Positive for dysuria. Negative for decreased urine volume, difficulty urinating, enuresis, flank pain, frequency, genital sores, hematuria, penile discharge, penile pain, penile swelling, scrotal swelling, testicular pain and urgency.  Musculoskeletal: Negative.   Neurological: Negative.   All other systems reviewed and are negative.   Physical Exam Updated Vital Signs BP 139/74   Pulse 77   Temp 98.4 F (36.9 C)   Resp 16   SpO2 99%  Physical Exam Vitals and nursing note reviewed.  Constitutional:      General: He is not in acute distress.    Appearance: He is well-developed. He is not ill-appearing or diaphoretic.  HENT:     Head: Atraumatic.  Eyes:     Pupils: Pupils are equal, round, and reactive to light.  Cardiovascular:     Rate and Rhythm: Normal rate and regular rhythm.  Pulmonary:     Effort: Pulmonary effort is normal. No respiratory distress.  Abdominal:     General: There is no distension.     Palpations: Abdomen  is soft.     Tenderness: There is no abdominal tenderness.     Hernia: No hernia is present.  Genitourinary:    Comments: declined Musculoskeletal:        General: Normal range of motion.     Cervical back: Normal range of motion and neck supple.  Skin:    General: Skin is warm and dry.  Neurological:     General: No focal deficit present.     Mental Status: He is alert and oriented to person, place, and time.    ED Results / Procedures / Treatments   Labs (all labs ordered are listed, but only abnormal results are displayed) Labs Reviewed  URINE CULTURE  RPR  HIV ANTIBODY (ROUTINE TESTING W REFLEX)    EKG None  Radiology No  results found.  Procedures Procedures    Medications Ordered in ED Medications - No data to display  ED Course/ Medical Decision Making/ A&P   22 year old here for evaluation of persistent dysuria over the last month or 2.  He initially thought he had chlamydia which he has had previously.  Seen 2 months ago his testing was negative, seen again 4 days ago had negative gonorrhea chlamydia testing at that time.  I did review his urine at that time which showed rare bacteria, trace leuks, 21-50 WBC.  He is afebrile, nonseptic, not ill-appearing.  GC/chlamydia testing -4 days ago.  Will treat for UTI given his STD testing was negative a few days ago given 2150 WBC.  I have sent urine for culture.  Did not have relief of symptoms with azithromycin, amoxicillin, doxycycline that he is prescribed previously over the last 2 months.  Will add on urine culture, change antibiotics, I did recommend he follow-up with urology given this issue has been going on for about 8 weeks.  Do not feel he did not additional labs or imaging here in the emergency department  Patient is nontoxic, nonseptic appearing, in no apparent distress.  Patient's pain and other symptoms adequately managed in emergency department.  Fluid bolus given.  Labs, imaging and vitals reviewed.  Patient does not meet the SIRS or Sepsis criteria.  On repeat exam patient does not have a surgical abdomin and there are no peritoneal signs.  No indication of appendicitis, bowel obstruction, bowel perforation, cholecystitis, diverticulitis, torsion, abscess, lymphogranuloma vernereum.  Patient discharged home with symptomatic treatment and given strict instructions for follow-up with their primary care physician.  I have also discussed reasons to return immediately to the ER.  Patient expresses understanding and agrees with plan.                                  Medical Decision Making Amount and/or Complexity of Data Reviewed External Data  Reviewed: labs and notes. Labs: ordered. Decision-making details documented in ED Course.  Risk OTC drugs. Prescription drug management. Decision regarding hospitalization. Diagnosis or treatment significantly limited by social determinants of health.          Final Clinical Impression(s) / ED Diagnoses Final diagnoses:  Dysuria    Rx / DC Orders ED Discharge Orders          Ordered    cephALEXin (KEFLEX) 500 MG capsule  3 times daily        04/11/23 1946              Dartanion Teo A, PA-C 04/11/23 1947  Glyn Ade, MD 04/11/23 2213

## 2023-04-12 LAB — URINE CULTURE: Culture: NO GROWTH

## 2023-04-12 LAB — RPR: RPR Ser Ql: NONREACTIVE

## 2023-10-08 NOTE — Progress Notes (Unsigned)
 Pt was seen on 10/08/2023 at event screening. BP was 128/67. Pt did not indicate he has a PCP. Pt is not a smoker. No SDOH needs indicated.

## 2024-01-14 NOTE — Progress Notes (Unsigned)
 The patient attended a screening event on 10/08/2023 where his BP screening results was 128/67. At the event the patient noted he has other insurance and does not smoke. Patient declined having any SDOH insecurities at the event. Pt did not list pcp. Per chart review pt pcp listed in [facility] is Dr. Olivia Lien, MD and the last office visit is not visible in [facility]. Chart review indicates pt insurance coverage is [place] [employer]. Post event initial f/u [facility] called pt pcp office on 01/14/2024 to confirm that pt is established with pcp and was last on 08/02/2023 for annual exam and pt was also seen on 11/01/2023 at [facility]. No additional Health equity team support indicated at this time.
